# Patient Record
Sex: Female | Born: 1980 | Race: White | Hispanic: No | Marital: Married | Smoking: Never smoker
Health system: Southern US, Community
[De-identification: ages and names within clinical notes are randomized; demographics above are authoritative.]

## PROBLEM LIST (undated history)

## (undated) DIAGNOSIS — F4310 Post-traumatic stress disorder, unspecified: Secondary | ICD-10-CM

## (undated) DIAGNOSIS — N809 Endometriosis, unspecified: Secondary | ICD-10-CM

## (undated) DIAGNOSIS — N3010 Interstitial cystitis (chronic) without hematuria: Secondary | ICD-10-CM

## (undated) DIAGNOSIS — F419 Anxiety disorder, unspecified: Secondary | ICD-10-CM

## (undated) DIAGNOSIS — F32A Depression, unspecified: Secondary | ICD-10-CM

## (undated) DIAGNOSIS — K589 Irritable bowel syndrome without diarrhea: Secondary | ICD-10-CM

## (undated) DIAGNOSIS — N3281 Overactive bladder: Secondary | ICD-10-CM

## (undated) HISTORY — DX: Anxiety disorder, unspecified: F41.9

## (undated) HISTORY — PX: BREAST IMPLANT EXCHANGE: SHX6296

## (undated) HISTORY — PX: BLADDER SURGERY: SHX569

## (undated) HISTORY — DX: Interstitial cystitis (chronic) without hematuria: N30.10

## (undated) HISTORY — DX: Overactive bladder: N32.81

## (undated) HISTORY — PX: TUBAL LIGATION: SHX77

## (undated) HISTORY — DX: Post-traumatic stress disorder, unspecified: F43.10

## (undated) HISTORY — DX: Endometriosis, unspecified: N80.9

## (undated) HISTORY — DX: Irritable bowel syndrome, unspecified: K58.9

## (undated) HISTORY — PX: PLACEMENT OF BREAST IMPLANTS: SHX6334

## (undated) HISTORY — DX: Depression, unspecified: F32.A

## (undated) HISTORY — PX: SMALL INTESTINE SURGERY: SHX150

---

## 2011-09-16 HISTORY — PX: IUD REMOVAL: SHX5392

## 2020-08-13 ENCOUNTER — Other Ambulatory Visit: Payer: Self-pay

## 2020-08-13 ENCOUNTER — Encounter: Payer: Self-pay | Admitting: Physical Therapy

## 2020-08-13 ENCOUNTER — Ambulatory Visit: Payer: Managed Care, Other (non HMO) | Attending: Obstetrics & Gynecology | Admitting: Physical Therapy

## 2020-08-13 DIAGNOSIS — R252 Cramp and spasm: Secondary | ICD-10-CM | POA: Diagnosis not present

## 2020-08-13 DIAGNOSIS — M6281 Muscle weakness (generalized): Secondary | ICD-10-CM

## 2020-08-13 NOTE — Therapy (Signed)
[facility] [address], [address] [place], [place], [address] Phone: [phone]   Fax:  [phone]  Physical Therapy Evaluation  Patient Details  Name: Tonya Richmond MRN: [id number] Date of Birth: 03-13-1981 Referring Provider (PT): Hilda Blades Mercy Moore, MD   Encounter Date: 08/13/2020   PT End of Session - 08/13/20 1054    Visit Number 1    Date for PT Re-Evaluation 11/05/20    Authorization Type cigna    PT Start Time 0803    PT Stop Time 0846    PT Time Calculation (min) 43 min    Activity Tolerance Patient tolerated treatment well           Past Medical History:  Diagnosis Date  . Endometriosis    per patient  . Forceps delivery    per patient    History reviewed. No pertinent surgical history.  There were no vitals filed for this visit.    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  Pain started 8 years ago.  She has been through a miscarriage that she believes was caused by IUD that became dislogded.  The IUD was later found attached to her small intestine and had punctured her uterus and bladder.  Pt has pain that flares regularly and is unable to plan things due to pain.    Pertinent History IUD that was disloddge; PTSD; depression/anxiety;    Limitations Walking;Standing    Patient Stated Goals get rid of the pain; be able to keep plans and not deal with flare ups    Currently in Pain? Yes    Pain Score 10-Worst pain ever   when it flares   Pain Location Abdomen    Pain Orientation Anterior;Posterior;Lower    Pain Descriptors / Indicators Burning;Sharp;Pressure    Pain Type Chronic pain    Pain Radiating Towards into low abdomen, vagina and low back    Pain Onset More than a month ago    Pain Frequency Intermittent    Aggravating Factors  intercourse; stress; on my feet too much; certain drinks and food    Pain  Relieving Factors lay down and heat; motrin    Effect of Pain on Daily Activities vacation, dinner plans, have intercourse    Multiple Pain Sites No              [facility] PT Assessment - 08/13/20 0001      Assessment   Medical Diagnosis R10.2 (ICD-10-CM) - Pelvic and perineal pain    Referring Provider (PT) Hilda Blades Mercy Moore, MD    Prior Therapy No      Precautions   Precautions None      Balance Screen   Has the patient fallen in the past 6 months No      Home Environment   Living Environment Private residence    Living Arrangements Spouse/significant other;Children   89 y/o is at home currently     Prior Function   Level of Independence Independent    Vocation On disability      Cognition   Overall Cognitive Status Within Functional Limits for tasks assessed      Posture/Postural Control   Posture/Postural Control Postural limitations    Postural Limitations Rounded Shoulders;Increased thoracic kyphosis;Increased lumbar lordosis      ROM / Strength   AROM / PROM / Strength AROM      AROM  Overall AROM Comments decreased thoracic and lumbar ROM flexion and ext 50%      Palpation   Palpation comment lumbar and gluteal muscles tight; adductors tight bilat      Ambulation/Gait   Gait Pattern Within Functional Limits                      Objective measurements completed on examination: See above findings.     Pelvic Floor Special Questions - 08/13/20 0001    Prior Pelvic/Prostate Exam Yes    Are you Pregnant or attempting pregnancy? No    Prior Pregnancies Yes    Number of Pregnancies 4    Number of Vaginal Deliveries 4    Any difficulty with labor and deliveries Yes   foreceps   Currently Sexually Active Yes    Is this Painful Yes    Marinoff Scale discomfort that does not affect completion   flares after sex    Urinary Leakage Yes    How often daily - a little bit throughout the day    Pad use 5x on bad day; sometimes don't need  when not flared    Activities that cause leaking Coughing;Laughing;Walking    Urinary urgency Yes    Urinary frequency some days 50-60x on flare; other days are normal    Falling out feeling (prolapse) Yes    Activities that cause feeling of prolapse feel it all the time    Skin Integrity Intact    Perineal Body/Introitus  Gaping    Prolapse Anterior Wall    Pelvic Floor Internal Exam pt identity confirmed and informed consent given to treat    Exam Type Vaginal    Palpation TTP bilateral levators    Strength weak squeeze, no lift    Strength # of reps 1   3 quick flicks   Strength # of seconds 2    Tone low bulbocavernosis; high levators            [facility] Adult PT Treatment/Exercise - 08/13/20 0001      Self-Care   Self-Care Other Self-Care Comments    Other Self-Care Comments  discussed breathing and thoracic extensions                    PT Short Term Goals - 08/13/20 1432      PT SHORT TERM GOAL #1   Title ind with self massage techniques    Time 4    Period Weeks    Status New    Target Date 09/10/20      PT SHORT TERM GOAL #2   Title Pt will be ind with initial HEP    Time 4    Period Weeks    Status New    Target Date 09/10/20             PT Long Term Goals - 08/13/20 1420      PT LONG TERM GOAL #1   Title Pt will report at least 50% less pain    Time 12    Period Weeks    Status New    Target Date 11/05/20      PT LONG TERM GOAL #2   Title Pt will be able to have intercourse with mild to no pain afterwards    Time 12    Period Weeks    Status New    Target Date 11/05/20      PT LONG TERM GOAL #3  Title Pt will be able to sustain pelvic floor contraction of 2/5 for at least 15 seconds in order to reduce leakage and improve pelvic stability instanding activities.    Baseline 2 sec    Time 12    Period Weeks    Status New    Target Date 11/05/20      PT LONG TERM GOAL #4   Title Pt will be ind with advanced HEP to maintain  improvements made in therapy    Time 12    Period Weeks    Status New    Target Date 11/05/20                  Plan - 08/13/20 1055    Clinical Impression Statement Pt has long history of pelvic pain. She has pelvic prolapse which she is getting surgically repaired in one week.  Much of the evaluation time was used to get her long history.  Pt has anterior wall prolapse and poor squeeze of bulbocavernosis muscles . Pt has 2/5 MMT pelvic floor with 3 quick flicks and can hold for 2 seconds for one rep.  She has very tight levator muscles that were TTP.  Pt has tight adductors . Pt has increased thoracic kyphosis and lumbar lordosis.  Tight lumbar region.  Pt will benefit from skilled PT to address soft tissue impairments and postural impairments for reduced pain and improved function.    Personal Factors and Comorbidities Time since onset of injury/illness/exacerbation;Past/Current Experience;Comorbidity 3+    Comorbidities 3+ vaginal deliveries; tearing; issues from IUD; PTSD; IC    Examination-Activity Limitations Continence;Toileting;Stand;Sit    Examination-Participation Restrictions Community Activity;Driving    Stability/Clinical Decision Making Evolving/Moderate complexity    Clinical Decision Making Moderate    Rehab Potential Excellent    PT Frequency 1x / week   she will start after okay my MD s/p bladder sling   PT Duration 12 weeks    PT Treatment/Interventions ADLs/Self Care Home Management;Biofeedback;Cryotherapy;Academic librarian;Therapeutic activities;Therapeutic exercise;Neuromuscular re-education;Patient/family education;Manual techniques;Dry needling;Passive range of motion    PT Next Visit Plan internal STM; bladder and vaginal MFR;    PT Home Exercise Plan deep breathing and thoracic extension    Consulted and Agree with Plan of Care Patient           Patient will benefit from skilled therapeutic intervention in order to improve  the following deficits and impairments:  Pain, Postural dysfunction, Impaired flexibility, Increased fascial restricitons, Decreased strength, Decreased coordination, Impaired tone, Increased muscle spasms, Difficulty walking  Visit Diagnosis: Cramp and spasm  Muscle weakness (generalized)     Problem List There are no problems to display for this patient.   Junious Silk, PT 08/13/2020, 2:42 PM  [facility] [address] [place], [place], [address] Phone: [phone]   Fax:  [phone]  Name: Tonya Richmond MRN: [id number] Date of Birth: 11-19-1980

## 2020-09-17 ENCOUNTER — Ambulatory Visit: Payer: Managed Care, Other (non HMO) | Admitting: Physical Therapy

## 2020-09-24 ENCOUNTER — Encounter: Payer: Managed Care, Other (non HMO) | Admitting: Physical Therapy

## 2020-10-09 ENCOUNTER — Encounter: Payer: Managed Care, Other (non HMO) | Admitting: Physical Therapy

## 2020-10-16 ENCOUNTER — Encounter: Payer: Managed Care, Other (non HMO) | Admitting: Physical Therapy

## 2020-10-30 ENCOUNTER — Ambulatory Visit: Payer: Managed Care, Other (non HMO) | Attending: Obstetrics & Gynecology | Admitting: Physical Therapy

## 2020-10-30 ENCOUNTER — Other Ambulatory Visit: Payer: Self-pay

## 2020-10-30 ENCOUNTER — Encounter: Payer: Self-pay | Admitting: Physical Therapy

## 2020-10-30 DIAGNOSIS — M6281 Muscle weakness (generalized): Secondary | ICD-10-CM | POA: Insufficient documentation

## 2020-10-30 DIAGNOSIS — R252 Cramp and spasm: Secondary | ICD-10-CM | POA: Diagnosis present

## 2020-10-30 NOTE — Patient Instructions (Addendum)
Toileting Techniques for Bowel Movements (Defecation) Using your belly (abdomen) and pelvic floor muscles to have a bowel movement is usually instinctive.  Sometimes people can have problems with these muscles and have to relearn proper defecation (emptying) techniques.  If you have weakness in your muscles, organs that are falling out, decreased sensation in your pelvis, or ignore your urge to go, you may find yourself straining to have a bowel movement.  You are straining if you are: . holding your breath or taking in a huge gulp of air and holding it  . keeping your lips and jaw tensed and closed tightly . turning red in the face because of excessive pushing or forcing . developing or worsening your  hemorrhoids . getting faint while pushing . not emptying completely and have to defecate many times a day  If you are straining, you are actually making it harder for yourself to have a bowel movement.  Many people find they are pulling up with the pelvic floor muscles and closing off instead of opening the anus. Due to lack pelvic floor relaxation and coordination the abdominal muscles, one has to work harder to push the feces out.  Many people have never been taught how to defecate efficiently and effectively.  Notice what happens to your body when you are having a bowel movement.  While you are sitting on the toilet pay attention to the following areas: . Jaw and mouth position . Angle of your hips   . Whether your feet touch the ground or not . Arm placement  . Spine position . Waist . Belly tension . Anus (opening of the anal canal)  An Evacuation/Defecation Plan   Here are the 4 basic points:  1. Lean forward enough for your elbows to rest on your knees 2. Support your feet on the floor or use a low stool if your feet don't touch the floor  3. Push out your belly as if you have swallowed a beach ball-you should feel a widening of your waist 4. Open and relax your pelvic floor muscles,  rather than tightening around the anus      The following conditions my require modifications to your toileting posture:  . If you have had surgery in the past that limits your back, hip, pelvic, knee or ankle flexibility . Constipation   Your healthcare practitioner may make the following additional suggestions and adjustments:  1) Sit on the toilet  a) Make sure your feet are supported. b) Notice your hip angle and spine position-most people find it effective to lean forward or raise their knees, which can help the muscles around the anus to relax  c) When you lean forward, place your forearms on your thighs for support  2) Relax suggestions a) Breath deeply in through your nose and out slowly through your mouth as if you are smelling the flowers and blowing out the candles. b) To become aware of how to relax your muscles, contracting and releasing muscles can be helpful.  Pull your pelvic floor muscles in tightly by using the image of holding back gas, or closing around the anus (visualize making a circle smaller) and lifting the anus up and in.  Then release the muscles and your anus should drop down and feel open. Repeat 5 times ending with the feeling of relaxation. c) Keep your pelvic floor muscles relaxed; let your belly bulge out. d) The digestive tract starts at the mouth and ends at the anal opening, so be   sure to relax both ends of the tube.  Place your tongue on the roof of your mouth with your teeth separated.  This helps relax your mouth and will help to relax the anus at the same time.  3) Empty (defecation) a) Keep your pelvic floor and sphincter relaxed, then bulge your anal muscles.  Make the anal opening wide.  b) Stick your belly out as if you have swallowed a beach ball. c) Make your belly wall hard using your belly muscles while continuing to breathe. Doing this makes it easier to open your anus. d) Breath out and give a grunt (or try using other sounds such as  ahhhh, shhhhh, ohhhh or grrrrrrr).  4) Finish a) As you finish your bowel movement, pull the pelvic floor muscles up and in.  This will leave your anus in the proper place rather than remaining pushed out and down. If you leave your anus pushed out and down, it will start to feel as though that is normal and give you incorrect signals about needing to have a bowel movement.   [facility] [address] [place], [place] [address]  

## 2020-10-30 NOTE — Therapy (Signed)
[facility] [address], [address] [place], [place], [address] Phone: [phone]   Fax:  [phone]  Physical Therapy Treatment  Patient Details  Name: Tonya Richmond MRN: [id number] Date of Birth: [birth year] Referring Provider (PT): Hilda Blades Mercy Moore, MD   Encounter Date: 10/30/2020   PT End of Session - 10/30/20 0924    Visit Number 2    Date for PT Re-Evaluation 01/22/21    Authorization Type cigna    PT Start Time [phone]    PT Stop Time 0927    PT Time Calculation (min) 40 min    Activity Tolerance Patient tolerated treatment well    Behavior During Therapy [facility] for tasks assessed/performed           Past Medical History:  Diagnosis Date  . Endometriosis    per patient  . Forceps delivery    per patient    History reviewed. No pertinent surgical history.  There were no vitals filed for this visit.   Subjective Assessment - 10/30/20 0925    Subjective I have been depressed and my IC is flared up    Pertinent History IUD that was disloddge; PTSD; depression/anxiety;    Patient Stated Goals get rid of the pain; be able to keep plans and not deal with flare ups    Currently in Pain? Yes    Pain Score 8     Pain Location Abdomen    Pain Orientation Lower    Pain Descriptors / Indicators Burning;Sharp    Pain Type Chronic pain    Pain Onset More than a month ago    Pain Frequency Intermittent    Aggravating Factors  IC flared    Multiple Pain Sites No                             [facility] Adult PT Treatment/Exercise - 10/30/20 0001      Self-Care   Other Self-Care Comments  tioleting techiques with breathing and position adjustments      Manual Therapy   Manual Therapy Myofascial release    Myofascial Release large intestine, rectum and bladder - all 6 planes of fascial release                  PT Education - 10/30/20 0957    Education Details toileting techniques  with breathing and positions    Person(s) Educated Patient    Methods Explanation;Demonstration;Verbal cues;Handout;Tactile cues    Comprehension Verbalized understanding;Returned demonstration            PT Short Term Goals - 08/13/20 1432      PT SHORT TERM GOAL #1   Title ind with self massage techniques    Time 4    Period Weeks    Status New    Target Date 09/10/20      PT SHORT TERM GOAL #2   Title Pt will be ind with initial HEP    Time 4    Period Weeks    Status New    Target Date 09/10/20             PT Long Term Goals - 10/30/20 1024      PT LONG TERM GOAL #1   Title Pt will report at least 50% less pain    Time 12    Period Weeks    Status On-going    Target Date 01/22/21  PT LONG TERM GOAL #2   Title Pt will be able to have intercourse with mild to no pain afterwards    Time 12    Period Weeks    Status On-going      PT LONG TERM GOAL #3   Title Pt will be able to sustain pelvic floor contraction of 2/5 for at least 15 seconds in order to reduce leakage and improve pelvic stability instanding activities.    Baseline 2 sec    Time 12    Period Weeks    Status On-going    Target Date 01/22/21      PT LONG TERM GOAL #4   Title Pt will be ind with advanced HEP to maintain improvements made in therapy    Time 12    Period Weeks    Status On-going    Target Date 01/22/21                 Plan - 10/30/20 0958    Clinical Impression Statement Pt felt relief with abdominal STM today.  Pt has recently had surgery so today is only the intitial visit since eval.  Pt demonstrates difficulty with bulging the pelvic floor and toileting techhiques were educated and performed today.  Pt will benefit from further skilled PT to address muscle strength and coordination as well was soft tissue tension so she can better manage pain and improve outcomes from surgical anterior wall repair.    Comorbidities 3+ vaginal deliveries; tearing; issues from IUD;  PTSD; IC    Examination-Activity Limitations Continence;Toileting;Stand;Sit    Examination-Participation Restrictions Community Activity;Driving    Rehab Potential Excellent    PT Frequency 1x / week    PT Duration 12 weeks    PT Treatment/Interventions ADLs/Self Care Home Management;Biofeedback;Cryotherapy;Academic librarian;Therapeutic activities;Therapeutic exercise;Neuromuscular re-education;Patient/family education;Manual techniques;Dry needling;Passive range of motion    PT Next Visit Plan internal STM; bladder and vaginal MFR; deep breathing and thoracic extension    PT Home Exercise Plan toileting techniques    Consulted and Agree with Plan of Care Patient           Patient will benefit from skilled therapeutic intervention in order to improve the following deficits and impairments:  Pain,Postural dysfunction,Impaired flexibility,Increased fascial restricitons,Decreased strength,Decreased coordination,Impaired tone,Increased muscle spasms,Difficulty walking  Visit Diagnosis: Cramp and spasm  Muscle weakness (generalized)     Problem List There are no problems to display for this patient.   Brayton Caves Klever Twyford, PT 10/30/2020, 10:32 AM  [facility] [address], [address] [place], [place], [address] Phone: [phone]   Fax:  [phone]  Name: Tonya Richmond MRN: [id number] Date of Birth: 02/21/1981

## 2020-11-06 ENCOUNTER — Ambulatory Visit: Payer: Managed Care, Other (non HMO) | Admitting: Physical Therapy

## 2020-11-06 ENCOUNTER — Encounter: Payer: Self-pay | Admitting: Physical Therapy

## 2020-11-06 ENCOUNTER — Other Ambulatory Visit: Payer: Self-pay

## 2020-11-06 DIAGNOSIS — R252 Cramp and spasm: Secondary | ICD-10-CM | POA: Diagnosis not present

## 2020-11-06 DIAGNOSIS — M6281 Muscle weakness (generalized): Secondary | ICD-10-CM

## 2020-11-06 NOTE — Patient Instructions (Addendum)
STRETCHING THE PELVIC FLOOR MUSCLES NO DILATOR  Supplies . Vaginal lubricant . Mirror (optional) . Gloves (optional) or clean hands Positioning . Start in a semi-reclined position with your head propped up. Bend your knees and place your thumb or finger at the vaginal opening. Procedure . Apply a moderate amount of lubricant on the outer skin of your vagina, the labia minora.  Apply additional lubricant to your finger. Marland Kitchen Spread the skin away from the vaginal opening. Place the end of your finger at the opening. . Do a maximum contraction of the pelvic floor muscles. Tighten the vagina and the anus maximally and relax. . When you know they are relaxed, gently and slowly insert your finger into your vagina, directing your finger slightly downward, for 2-3 inches of insertion. . Relax and stretch the 6 o'clock position . Hold each stretch for _30-60 seconds, no pain more than 3/10 . Repeat the stretching in the 4 o'clock and 8 o'clock positions. . Next gently move your finger in a "U" shape  several times.  . You can also enter a second finger to work to spread the vaginal opening wider from 3:00-6:00 and 6:00-9:00 or 3:00-9:00 . Perform daily or every other day . Once you have accomplished the techniques you may try them in standing with one foot resting on the tub, or in other positions.  This is a good stretch to do in the shower if you don't need to use lubricant.  This can be done at 35 weeks or later in your pregnancy.   Access Code: [email or web address] URL: [email or web address] Date: 11/06/2020 Prepared by: Dwana Curd  Exercises Thoracic Extension Mobilization with Noodle - 1 x daily - 7 x weekly - 3 sets - 10 reps Open Book Chest Stretch on Towel Roll - 1 x daily - 7 x weekly - 3 sets - 10 reps Supine Butterfly Groin Stretch - 1 x daily - 7 x weekly - 1 sets - 3 reps - 30 sec hold Supine Piriformis Stretch - 1 x daily - 7 x weekly - 1 sets - 3 reps - 30 sec hold

## 2020-11-06 NOTE — Therapy (Signed)
[facility] [address] [place], [place], [address] Phone: [phone]   Fax:  [phone]  Physical Therapy Treatment  Patient Details  Name: Tonya Richmond MRN: [id number] Date of Birth: [birth year] Referring Provider (PT): Hilda Blades Mercy Moore, MD   Encounter Date: 11/06/2020   PT End of Session - 11/06/20 0854    Visit Number 3    Date for PT Re-Evaluation 01/22/21    Authorization Type cigna    PT Start Time [phone]    PT Stop Time 0930    PT Time Calculation (min) 40 min    Activity Tolerance Patient tolerated treatment well    Behavior During Therapy [facility] for tasks assessed/performed           Past Medical History:  Diagnosis Date  . Endometriosis    per patient  . Forceps delivery    per patient    History reviewed. No pertinent surgical history.  There were no vitals filed for this visit.   Subjective Assessment - 11/06/20 0852    Subjective Pt states she felt better after the previous treatment and has been doing some abdominal massage that helps.  Pt states she had a flare last night and it was 10+ pain level and was up all night urinating.    Pertinent History IUD that was disloddge; PTSD; depression/anxiety;    Patient Stated Goals get rid of the pain; be able to keep plans and not deal with flare ups    Currently in Pain? Yes    Pain Score 6     Pain Location Abdomen    Pain Orientation Lower    Pain Descriptors / Indicators Burning    Pain Type Chronic pain    Pain Onset More than a month ago    Pain Frequency Intermittent    Multiple Pain Sites No                             [facility] Adult PT Treatment/Exercise - 11/06/20 0001      Exercises   Exercises Lumbar      Lumbar Exercises: Stretches   Other Lumbar Stretch Exercise piriformis, butterfly, thoracic ext, pec stretch - 2 min each      Manual Therapy   Manual Therapy Internal Pelvic Floor    Manual  therapy comments identity confirmed and consent to do internal pelvic floor    Myofascial Release large intestine, rectum and bladder - all 6 planes of fascial release    Internal Pelvic Floor levators and obdurators bilaterally, fascial mobs to urethra                  PT Education - 11/06/20 0933    Education Details Access Code: [email or web address]    Person(s) Educated Patient    Methods Explanation;Demonstration;Tactile cues;Handout;Verbal cues    Comprehension Verbalized understanding;Returned demonstration            PT Short Term Goals - 08/13/20 1432      PT SHORT TERM GOAL #1   Title ind with self massage techniques    Time 4    Period Weeks    Status New    Target Date 09/10/20      PT SHORT TERM GOAL #2   Title Pt will be ind with initial HEP    Time 4    Period Weeks    Status New    Target Date 09/10/20  PT Long Term Goals - 10/30/20 1024      PT LONG TERM GOAL #1   Title Pt will report at least 50% less pain    Time 12    Period Weeks    Status On-going    Target Date 01/22/21      PT LONG TERM GOAL #2   Title Pt will be able to have intercourse with mild to no pain afterwards    Time 12    Period Weeks    Status On-going      PT LONG TERM GOAL #3   Title Pt will be able to sustain pelvic floor contraction of 2/5 for at least 15 seconds in order to reduce leakage and improve pelvic stability instanding activities.    Baseline 2 sec    Time 12    Period Weeks    Status On-going    Target Date 01/22/21      PT LONG TERM GOAL #4   Title Pt will be ind with advanced HEP to maintain improvements made in therapy    Time 12    Period Weeks    Status On-going    Target Date 01/22/21                 Plan - 11/06/20 0936    Clinical Impression Statement Pt did well with STM internal.  She has a lot of tension but did have some increased muscle length after treatment.  Stretches given to continue to maintain muscle length and  manage pain.    PT Treatment/Interventions ADLs/Self Care Home Management;Biofeedback;Cryotherapy;Academic librarian;Therapeutic activities;Therapeutic exercise;Neuromuscular re-education;Patient/family education;Manual techniques;Dry needling;Passive range of motion    PT Next Visit Plan internal STM; bladder and vaginal MFR; deep breathing and thoracic extension    PT Home Exercise Plan toileting techniques    Consulted and Agree with Plan of Care Patient           Patient will benefit from skilled therapeutic intervention in order to improve the following deficits and impairments:  Pain,Postural dysfunction,Impaired flexibility,Increased fascial restricitons,Decreased strength,Decreased coordination,Impaired tone,Increased muscle spasms,Difficulty walking  Visit Diagnosis: Cramp and spasm  Muscle weakness (generalized)     Problem List There are no problems to display for this patient.   Brayton Caves Helaine Yackel, PT 11/06/2020, 9:45 AM  [facility] [address], [address] [place], [place], [address] Phone: [phone]   Fax:  [phone]  Name: Brihanna Devenport MRN: [id number] Date of Birth: 07-Mar-1981

## 2020-11-13 ENCOUNTER — Encounter: Payer: Managed Care, Other (non HMO) | Admitting: Physical Therapy

## 2020-11-20 ENCOUNTER — Encounter: Payer: Managed Care, Other (non HMO) | Admitting: Physical Therapy

## 2020-11-27 ENCOUNTER — Other Ambulatory Visit: Payer: Self-pay

## 2020-11-27 ENCOUNTER — Ambulatory Visit: Payer: Managed Care, Other (non HMO) | Attending: Obstetrics & Gynecology | Admitting: Physical Therapy

## 2020-11-27 DIAGNOSIS — R252 Cramp and spasm: Secondary | ICD-10-CM

## 2020-11-27 DIAGNOSIS — M6281 Muscle weakness (generalized): Secondary | ICD-10-CM | POA: Insufficient documentation
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
## 2020-12-04 ENCOUNTER — Encounter: Payer: Managed Care, Other (non HMO) | Admitting: Physical Therapy

## 2020-12-11 ENCOUNTER — Encounter: Payer: Managed Care, Other (non HMO) | Admitting: Physical Therapy

## 2020-12-12 ENCOUNTER — Ambulatory Visit (INDEPENDENT_AMBULATORY_CARE_PROVIDER_SITE_OTHER)
Admission: RE | Admit: 2020-12-12 | Discharge: 2020-12-12 | Disposition: A | Discharge status: Home / Self Care | Payer: Managed Care, Other (non HMO) | Source: Ambulatory Visit | Attending: Internal Medicine | Admitting: Internal Medicine

## 2020-12-12 ENCOUNTER — Encounter: Payer: Self-pay | Admitting: Internal Medicine

## 2020-12-12 ENCOUNTER — Ambulatory Visit: Payer: Managed Care, Other (non HMO) | Admitting: Internal Medicine

## 2020-12-12 ENCOUNTER — Other Ambulatory Visit: Payer: Self-pay

## 2020-12-12 VITALS — BP 80/60 | HR 76 | Ht 62.5 in | Wt 127.0 lb

## 2020-12-12 DIAGNOSIS — K581 Irritable bowel syndrome with constipation: Secondary | ICD-10-CM

## 2020-12-12 DIAGNOSIS — N819 Female genital prolapse, unspecified: Secondary | ICD-10-CM

## 2020-12-12 DIAGNOSIS — N3010 Interstitial cystitis (chronic) without hematuria: Secondary | ICD-10-CM

## 2020-12-12 MED ORDER — LUBIPROSTONE 8 MCG PO CAPS: 8.0000 ug | ORAL_CAPSULE | Freq: Two times a day (BID) | ORAL | 2 refills | Status: DC

## 2020-12-12 NOTE — Patient Instructions (Addendum)
  Your provider has requested that you go to the basement level for Xray before leaving today. Press "B" on the elevator. The Xray Dept is located at the door straight ahead as you exit the elevator.  We have sent the following medications to your pharmacy for you to pick up at your convenience: Amitiza  You have a follow up appointment with Dr. Leone Payor on 02/25/2021 at 9:30 am.  Please remember to call [facility] and schedule an appointment with Dr. Marcelyn Bruins.  If you are age 78 or younger, your body mass index should be between 19-25. Your Body mass index is 22.86 kg/m. If this is out of the aformentioned range listed, please consider follow up with your Primary Care Provider.   Due to recent changes in healthcare laws, you may see the results of your imaging and laboratory studies on MyChart before your provider has had a chance to review them.  We understand that in some cases there may be results that are confusing or concerning to you. Not all laboratory results come back in the same time frame and the provider may be waiting for multiple results in order to interpret others.  Please give [place] 48 hours in order for your provider to thoroughly review all the results before contacting the office for clarification of your results.    I appreciate the opportunity to care for you. Stan Head, MD, [facility]
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
## 2020-12-13 ENCOUNTER — Telehealth: Payer: Self-pay

## 2020-12-13 NOTE — Telephone Encounter (Signed)
I tried to do the prior authorization thru [employer] for patients lubiprostone 8 mcg capsules yesterday. [employer] wants me to call [phone] which I did today and spoke with Silvestre Mesi the customer service rep. She took the information and it has been sent to clinical review. 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 Will await the outcome.

## 2020-12-14 MED ORDER — AMITIZA 8 MCG PO CAPS: 8.0000 ug | ORAL_CAPSULE | Freq: Two times a day (BID) | ORAL | 1 refills | Status: AC

## 2020-12-14 NOTE — Telephone Encounter (Signed)
We received a fax that the generic Amitiza is not covered but the brand is covered. I will resend the rx in for the brand name Amitiza.

## 2021-02-25 ENCOUNTER — Ambulatory Visit: Payer: Managed Care, Other (non HMO) | Admitting: Internal Medicine

## 2022-11-01 IMAGING — DX DG ABDOMEN 1V
1 series · 1 of 1 positions shown · non-contrast
Comparison: None.

CLINICAL DATA: Chronic constipation.

EXAM:
ABDOMEN - 1 VIEW

[abdomen kub]
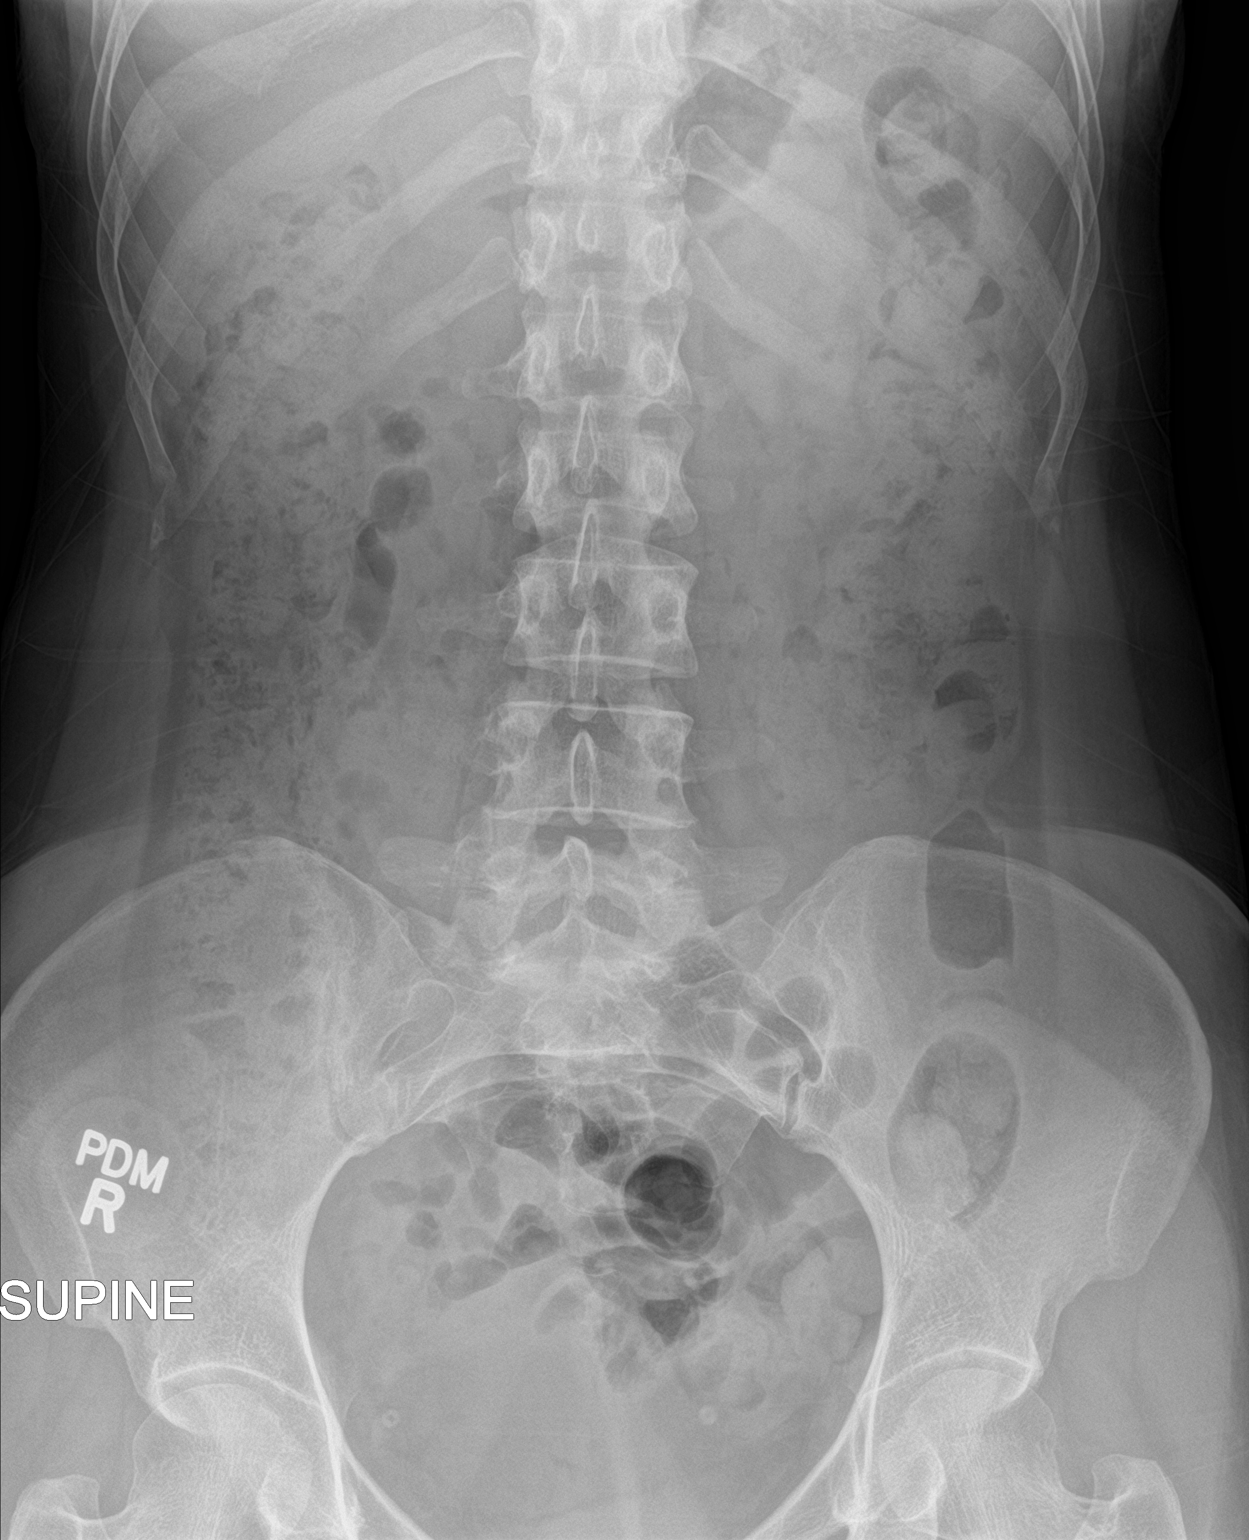

[1 of 1 positions shown; findings below may reference images not displayed]

FINDINGS: The bowel gas pattern is normal. No radio-opaque calculi or other
significant radiographic abnormality are seen. There is an above
average amount of stool in the colon.
IMPRESSION: Nonobstructive bowel gas pattern. Above average amount of stool in
the colon.
